# Patient Record
Sex: Female | Born: 1945 | Race: White | Hispanic: No | Smoking: Never smoker
Health system: Southern US, Community
[De-identification: ages and names within clinical notes are randomized; demographics above are authoritative.]

## PROBLEM LIST (undated history)

## (undated) ENCOUNTER — Emergency Department (HOSPITAL_COMMUNITY): Admission: EM | Payer: Self-pay | Source: Home / Self Care

## (undated) DIAGNOSIS — E119 Type 2 diabetes mellitus without complications: Secondary | ICD-10-CM

## (undated) DIAGNOSIS — E079 Disorder of thyroid, unspecified: Secondary | ICD-10-CM

## (undated) HISTORY — DX: Disorder of thyroid, unspecified: E07.9

## (undated) HISTORY — DX: Type 2 diabetes mellitus without complications: E11.9

## (undated) MED FILL — Ferumoxytol Inj 510 MG/17ML (30 MG/ML) (Elemental Fe): INTRAVENOUS | Qty: 17 | Status: AC

---

## 2014-03-16 ENCOUNTER — Ambulatory Visit: Payer: Self-pay | Admitting: Ophthalmology

## 2014-03-16 ENCOUNTER — Encounter (INDEPENDENT_AMBULATORY_CARE_PROVIDER_SITE_OTHER): Payer: Self-pay | Admitting: Ophthalmology

## 2014-06-01 ENCOUNTER — Ambulatory Visit: Payer: Self-pay | Admitting: Family Medicine

## 2016-01-23 ENCOUNTER — Ambulatory Visit (INDEPENDENT_AMBULATORY_CARE_PROVIDER_SITE_OTHER): Payer: Medicare Other | Admitting: Podiatry

## 2016-01-23 ENCOUNTER — Ambulatory Visit (INDEPENDENT_AMBULATORY_CARE_PROVIDER_SITE_OTHER): Payer: Medicare Other

## 2016-01-23 ENCOUNTER — Encounter: Payer: Self-pay | Admitting: Podiatry

## 2016-01-23 DIAGNOSIS — M722 Plantar fascial fibromatosis: Secondary | ICD-10-CM

## 2016-01-23 MED ORDER — MELOXICAM 15 MG PO TABS: 15.0000 mg | ORAL_TABLET | Freq: Every day | ORAL | Status: DC

## 2016-01-23 NOTE — Patient Instructions (Signed)

## 2016-01-23 NOTE — Progress Notes (Signed)
   Subjective:    Patient ID: Debbie Lopez, female    DOB: Jul 24, 1946, 70 y.o.   MRN: [id number]  HPI: She presents today with a chief complaint of a painful right heel. States is the bothering her for several months and he seems to be getting worse with the first step in the morning. She's tried over-the-counter inserts to no avail.    Review of Systems  Musculoskeletal: Positive for gait problem.       Objective:   Physical Exam: Vital signs are stable she is alert and oriented 3. Strong palpable pulses bilateral. Neurologic sensorium is intact per Semmes-Weinstein monofilament. Deep tendon reflexes are intact bilaterally muscle strength is 5 over 5 dorsiflexion plantar flexors and inverters everters all intrinsic musculature is intact. Orthopedic evaluation of his result to assist with ankle range of motion without crepitation. She has pain on palpation medial calcaneal tubercle of the right heel. No pain on palpation in the calf. Radius taken today demonstrate a thick soft tissue region of the plantar fasciitis insertion site. No fractures identified.        Assessment & Plan:  Plantar fasciitis right.  Plan: Injected the right heel today with Kenalog and local anesthetic. Started her on meloxicam 15 mg 1 by mouth daily. Also place her in a plantar fascial brace and a night splint. Discussed proper shoe gear stretching exercises ice therapy in a sugar modification. We discussed the etiology pathology conservative or surgical therapies. I will follow-up with her in 1 month.

## 2016-02-20 ENCOUNTER — Ambulatory Visit (INDEPENDENT_AMBULATORY_CARE_PROVIDER_SITE_OTHER): Payer: Medicare Other | Admitting: Podiatry

## 2016-02-20 ENCOUNTER — Encounter: Payer: Self-pay | Admitting: Podiatry

## 2016-02-20 VITALS — BP 153/92 | HR 86 | Resp 12

## 2016-02-20 DIAGNOSIS — M722 Plantar fascial fibromatosis: Secondary | ICD-10-CM

## 2016-02-20 NOTE — Progress Notes (Signed)
She presents today for a follow-up of plantar fasciitis of the right foot states that she is approximately 85-95% better. She continues conservative therapies including plantar fascial brace night splint and meloxicam.  Objective: Vital signs stable alert and oriented 3. Pulses are palpable. She has no reproducible pain on palpation in medial continue tubercle of the right heel.  Assessment: Resolving plantar fasciitis right.  Plan: Discussed etiology pathology conservative versus surgical therapies. I recommend she continue all conservative therapies until she is 1 month out of pain.

## 2016-04-02 ENCOUNTER — Ambulatory Visit: Payer: Medicare Other | Admitting: Podiatry

## 2016-05-12 ENCOUNTER — Other Ambulatory Visit: Payer: Self-pay | Admitting: Podiatry

## 2016-11-10 ENCOUNTER — Other Ambulatory Visit: Payer: Self-pay | Admitting: Podiatry

## 2016-12-12 ENCOUNTER — Other Ambulatory Visit: Payer: Self-pay | Admitting: Podiatry

## 2017-01-07 ENCOUNTER — Other Ambulatory Visit: Payer: Self-pay | Admitting: Podiatry

## 2017-03-15 ENCOUNTER — Other Ambulatory Visit: Payer: Self-pay | Admitting: Family Medicine

## 2017-03-15 DIAGNOSIS — Z1231 Encounter for screening mammogram for malignant neoplasm of breast: Secondary | ICD-10-CM

## 2017-04-12 ENCOUNTER — Encounter: Payer: Self-pay | Admitting: Radiology

## 2017-04-12 ENCOUNTER — Ambulatory Visit
Admission: RE | Admit: 2017-04-12 | Discharge: 2017-04-12 | Disposition: A | Discharge status: Home / Self Care | Payer: Medicare Other | Source: Ambulatory Visit | Attending: Family Medicine | Admitting: Family Medicine

## 2017-04-12 DIAGNOSIS — Z1231 Encounter for screening mammogram for malignant neoplasm of breast: Secondary | ICD-10-CM | POA: Diagnosis not present

## 2019-07-31 ENCOUNTER — Other Ambulatory Visit: Payer: Self-pay

## 2019-07-31 DIAGNOSIS — Z20822 Contact with and (suspected) exposure to covid-19: Secondary | ICD-10-CM

## 2019-08-02 LAB — NOVEL CORONAVIRUS, NAA: SARS-CoV-2, NAA: NOT DETECTED

## 2020-05-31 DEATH — deceased

## 2022-11-19 ENCOUNTER — Inpatient Hospital Stay: Payer: Medicare Other

## 2022-11-19 ENCOUNTER — Inpatient Hospital Stay: Payer: Medicare Other | Attending: Internal Medicine | Admitting: Internal Medicine

## 2022-11-19 ENCOUNTER — Encounter: Payer: Self-pay | Admitting: Internal Medicine

## 2022-11-19 VITALS — BP 144/60 | HR 87 | Temp 98.7°F | Resp 20 | Wt 178.0 lb

## 2022-11-19 DIAGNOSIS — D509 Iron deficiency anemia, unspecified: Secondary | ICD-10-CM | POA: Diagnosis not present

## 2022-11-19 DIAGNOSIS — D61818 Other pancytopenia: Secondary | ICD-10-CM | POA: Insufficient documentation

## 2022-11-19 DIAGNOSIS — D696 Thrombocytopenia, unspecified: Secondary | ICD-10-CM | POA: Diagnosis not present

## 2022-11-19 DIAGNOSIS — D72819 Decreased white blood cell count, unspecified: Secondary | ICD-10-CM | POA: Insufficient documentation

## 2022-11-19 DIAGNOSIS — E119 Type 2 diabetes mellitus without complications: Secondary | ICD-10-CM | POA: Insufficient documentation

## 2022-11-19 DIAGNOSIS — E039 Hypothyroidism, unspecified: Secondary | ICD-10-CM | POA: Insufficient documentation

## 2022-11-19 DIAGNOSIS — Z79899 Other long term (current) drug therapy: Secondary | ICD-10-CM | POA: Diagnosis not present

## 2022-11-19 DIAGNOSIS — Z809 Family history of malignant neoplasm, unspecified: Secondary | ICD-10-CM | POA: Diagnosis not present

## 2022-11-19 DIAGNOSIS — G629 Polyneuropathy, unspecified: Secondary | ICD-10-CM | POA: Insufficient documentation

## 2022-11-19 DIAGNOSIS — Z8719 Personal history of other diseases of the digestive system: Secondary | ICD-10-CM | POA: Insufficient documentation

## 2022-11-19 DIAGNOSIS — E538 Deficiency of other specified B group vitamins: Secondary | ICD-10-CM | POA: Insufficient documentation

## 2022-11-19 DIAGNOSIS — Z7984 Long term (current) use of oral hypoglycemic drugs: Secondary | ICD-10-CM | POA: Insufficient documentation

## 2022-11-19 DIAGNOSIS — D649 Anemia, unspecified: Secondary | ICD-10-CM | POA: Insufficient documentation

## 2022-11-19 LAB — CBC WITH DIFFERENTIAL/PLATELET
Abs Immature Granulocytes: 0.01 10*3/uL (ref 0.00–0.07)
Basophils Absolute: 0 10*3/uL (ref 0.0–0.1)
Basophils Relative: 1 %
Eosinophils Absolute: 0 10*3/uL (ref 0.0–0.5)
Eosinophils Relative: 0 %
HCT: 33.3 % — ABNORMAL LOW (ref 36.0–46.0)
Hemoglobin: 10.1 g/dL — ABNORMAL LOW (ref 12.0–15.0)
Immature Granulocytes: 0 %
Lymphocytes Relative: 26 %
Lymphs Abs: 1.4 10*3/uL (ref 0.7–4.0)
MCH: 25.8 pg — ABNORMAL LOW (ref 26.0–34.0)
MCHC: 30.3 g/dL (ref 30.0–36.0)
MCV: 85.2 fL (ref 80.0–100.0)
Monocytes Absolute: 0.6 10*3/uL (ref 0.1–1.0)
Monocytes Relative: 12 %
Neutro Abs: 3.2 10*3/uL (ref 1.7–7.7)
Neutrophils Relative %: 61 %
Platelets: 145 10*3/uL — ABNORMAL LOW (ref 150–400)
RBC: 3.91 MIL/uL (ref 3.87–5.11)
RDW: 17.4 % — ABNORMAL HIGH (ref 11.5–15.5)
WBC: 5.3 10*3/uL (ref 4.0–10.5)
nRBC: 0 % (ref 0.0–0.2)

## 2022-11-19 LAB — IRON AND TIBC
Iron: 51 ug/dL (ref 28–170)
Saturation Ratios: 10 % — ABNORMAL LOW (ref 10.4–31.8)
TIBC: 517 ug/dL — ABNORMAL HIGH (ref 250–450)
UIBC: 466 ug/dL

## 2022-11-19 LAB — FOLATE: Folate: 15 ng/mL (ref >5.9)

## 2022-11-19 LAB — VITAMIN B12: Vitamin B-12: 164 pg/mL — ABNORMAL LOW (ref 180–914)

## 2022-11-19 LAB — HEPATITIS C ANTIBODY: HCV Ab: NONREACTIVE

## 2022-11-19 LAB — HEPATITIS B CORE ANTIBODY, IGM: Hep B C IgM: NONREACTIVE

## 2022-11-19 LAB — FERRITIN: Ferritin: 5 ng/mL — ABNORMAL LOW (ref 11–307)

## 2022-11-19 LAB — RETICULOCYTES
Immature Retic Fract: 21 % — ABNORMAL HIGH (ref 2.3–15.9)
RBC.: 3.9 MIL/uL (ref 3.87–5.11)
Retic Count, Absolute: 67.1 10*3/uL (ref 19.0–186.0)
Retic Ct Pct: 1.7 % (ref 0.4–3.1)

## 2022-11-19 LAB — HEPATITIS B SURFACE ANTIGEN: Hepatitis B Surface Ag: NONREACTIVE

## 2022-11-19 NOTE — Progress Notes (Signed)
[facility]  Telephone:(336) [phone] [phone]  ID: Debbie Lopez OB: [birth year]  MR#: [id number]  QVZ#:[id number]  Patient Care Team: Kandyce Rud, MD as PCP - General (Family Medicine)  REFERRING PROVIDER: Pancytopenia  REASON FOR REFERRAL: Dr. Larwance Sachs  HPI: Debbie Lopez is a 76 y.o. female with past medical history of diabetes, neuropathy and hypothyroidism was referred to hematology for pancytopenia.  Patient had routine blood work with her PCP on 11/05/2022.  WBC 3.6, hemoglobin 9.2, MCV 86, platelet 100, ANC 2.29 and lymphocyte 0.84.  CBC from May 2023 showed normal WBC and mild anemia hemoglobin 11.2.  Patient has history of low platelets at least since April 2019 ranging high 90s to 130.  Patient denies any recent illnesses.  2 weeks ago she had hemorrhoidal bleed which lasted for about 4 days but has resolved now.  Denies any new medications or herbal supplements.  Dr. Larwance Sachs discontinued her meloxicam recently.  She has chronic muscle aches.  Denies any fever, chills, weight loss.  REVIEW OF SYSTEMS:   ROS  As per HPI. Otherwise, a complete review of systems is negative.  PAST MEDICAL HISTORY: Past Medical History:  Diagnosis Date   Diabetes mellitus without complication (HCC)    Thyroid disease     PAST SURGICAL HISTORY: History reviewed. No pertinent surgical history.  FAMILY HISTORY: Family History  Problem Relation Age of Onset   Cancer Maternal Grandmother     HEALTH MAINTENANCE: Social History   Tobacco Use   Smoking status: Never  Substance Use Topics   Alcohol use: Yes   Drug use: No     No Known Allergies  Current Outpatient Medications  Medication Sig Dispense Refill   aspirin 325 MG tablet Take by mouth.     atorvastatin (LIPITOR) 10 MG tablet Take by mouth.     atorvastatin (LIPITOR) 10 MG tablet TAKE 1 TABLET (10 MG TOTAL) BY MOUTH NIGHTLY.  1   glipiZIDE (GLUCOTROL XL) 10 MG 24 hr tablet TAKE 1  TABLET(10 MG) BY MOUTH EVERY DAY     levothyroxine (SYNTHROID, LEVOTHROID) 100 MCG tablet TAKE 1 TABLET BY MOUTH ONCE A DAY ON EMPTY STOMACH WITH GLASS OF WATER 30 MINUTES BEFORE BREAKFAST     levothyroxine (SYNTHROID, LEVOTHROID) 100 MCG tablet TAKE 1 TABLET BY MOUTH ONCE A DAY ON EMPTY STOMACH WITH GLASS OF WATER 30 MINUTES BEFORE BREAKFAST  5   LYRICA 75 MG capsule Take 75 mg by mouth 2 (two) times daily.  1   metFORMIN (GLUCOPHAGE) 500 MG tablet Take by mouth.     metFORMIN (GLUCOPHAGE) 500 MG tablet Take 500 mg by mouth 2 (two) times daily with a meal.  3   pantoprazole (PROTONIX) 20 MG tablet TAKE 1 TABLET(20 MG) BY MOUTH EVERY DAY     pioglitazone (ACTOS) 30 MG tablet Take by mouth.     pregabalin (LYRICA) 75 MG capsule Take by mouth.     Silica (SILICON DIOXIDE) POWD Take by mouth.     meloxicam (MOBIC) 15 MG tablet TAKE 1 TABLET BY MOUTH EVERY DAY 30 tablet 0   No current facility-administered medications for this visit.    OBJECTIVE: Vitals:   11/19/22 1406  BP: (!) 144/60  Pulse: 87  Resp: 20  Temp: 98.7 F (37.1 C)  SpO2: 100%     There is no height or weight on file to calculate BMI.      General: Well-developed, well-nourished, no acute distress. Eyes: Pink conjunctiva, anicteric sclera.  HEENT: Normocephalic, moist mucous membranes, clear oropharnyx. Lungs: Clear to auscultation bilaterally. Heart: Regular rate and rhythm. No rubs, murmurs, or gallops. Abdomen: Soft, nontender, nondistended. No organomegaly noted, normoactive bowel sounds. Musculoskeletal: No edema, cyanosis, or clubbing. Neuro: Alert, answering all questions appropriately. Cranial nerves grossly intact. Skin: No rashes or petechiae noted. Psych: Normal affect. Lymphatics: No cervical, calvicular, axillary or inguinal LAD.   LAB RESULTS:  No results found for: "NA", "K", "CL", "CO2", "GLUCOSE", "BUN", "CREATININE", "CALCIUM", "PROT", "ALBUMIN", "AST", "ALT", "ALKPHOS", "BILITOT", "GFRNONAA",  "GFRAA"  Lab Results  Component Value Date   WBC 5.3 11/19/2022   NEUTROABS 3.2 11/19/2022   HGB 10.1 (L) 11/19/2022   HCT 33.3 (L) 11/19/2022   MCV 85.2 11/19/2022   PLT 145 (L) 11/19/2022    Lab Results  Component Value Date   TIBC 517 (H) 11/19/2022   FERRITIN 5 (L) 11/19/2022   IRONPCTSAT 10 (L) 11/19/2022     STUDIES: No results found.  ASSESSMENT AND PLAN:   Debbie Lopez is a 76 y.o. female with pmh of diabetes, neuropathy and hypothyroidism was referred to hematology for pancytopenia.  #Pancytopenia  -New.  Of unclear etiology. -Patient has longstanding history of thrombocytopenia at least since 2019.  Her platelet counts are stable.  However recent blood work shows leukopenia and anemia.  Patient denies any recent illnesses, new medication or herbal supplements. -Labs as below to look for etiology.  Orders Placed This Encounter  Procedures   Iron and TIBC([facility])   Ferritin   CBC with Differential   Vitamin B12   Folate   Reticulocytes   Flow cytometry panel-leukemia/lymphoma work-up   Copper, serum   Hepatitis B core antibody, IgM   Hepatitis B surface antigen   Hepatitis C antibody   RTC in 2 weeks for MD visit to discuss labs.  Patient expressed understanding and was in agreement with this plan. She also understands that She can call clinic at any time with any questions, concerns, or complaints.   I spent a total of 45 minutes reviewing chart data, face-to-face evaluation with the patient, counseling and coordination of care as detailed above.  Michaelyn Barter, MD   11/19/2022 4:24 PM

## 2022-11-23 LAB — COMP PANEL: LEUKEMIA/LYMPHOMA

## 2022-11-29 ENCOUNTER — Inpatient Hospital Stay: Payer: Medicare Other | Admitting: Internal Medicine

## 2022-11-29 ENCOUNTER — Encounter: Payer: Self-pay | Admitting: Internal Medicine

## 2022-11-29 VITALS — HR 93 | Temp 96.3°F | Resp 20 | Wt 181.7 lb

## 2022-11-29 DIAGNOSIS — D61818 Other pancytopenia: Secondary | ICD-10-CM | POA: Diagnosis not present

## 2022-11-29 DIAGNOSIS — D696 Thrombocytopenia, unspecified: Secondary | ICD-10-CM | POA: Diagnosis not present

## 2022-11-29 DIAGNOSIS — D509 Iron deficiency anemia, unspecified: Secondary | ICD-10-CM | POA: Diagnosis not present

## 2022-11-29 DIAGNOSIS — E538 Deficiency of other specified B group vitamins: Secondary | ICD-10-CM | POA: Diagnosis not present

## 2022-11-29 LAB — COPPER, SERUM: Copper: 130 ug/dL (ref 80–158)

## 2022-11-29 NOTE — Progress Notes (Signed)
[facility]  Telephone:(336) [phone] Fax:(336) [phone]  ID: Debbie Lopez OB: Sep 22, 1946  MR#: [id number]  IRW#:[id number]  Patient Care Team: Kandyce Rud, MD as PCP - General (Family Medicine)  REFERRING PROVIDER: Pancytopenia  REASON FOR REFERRAL: Dr. Larwance Sachs  HPI: Debbie Lopez is a 76 y.o. female with past medical history of diabetes, neuropathy and hypothyroidism was referred to hematology for pancytopenia.  Patient had routine blood work with her PCP on 11/05/2022.  WBC 3.6, hemoglobin 9.2, MCV 86, platelet 100, ANC 2.29 and lymphocyte 0.84.  CBC from May 2023 showed normal WBC and mild anemia hemoglobin 11.2.  Patient has history of low platelets at least since April 2019 ranging high 90s to 130.  Patient denies any recent illnesses.  2 weeks ago she had hemorrhoidal bleed which lasted for about 4 days but has resolved now.  Denies any new medications or herbal supplements.  Dr. Larwance Sachs discontinued her meloxicam recently.  She has chronic muscle aches.  Denies any fever, chills, weight loss.  Interval history Patient was seen today to discuss labs. She has been feeling well.  Denies any complaints.   REVIEW OF SYSTEMS:   Review of Systems  All other systems reviewed and are negative.   As per HPI. Otherwise, a complete review of systems is negative.  PAST MEDICAL HISTORY: Past Medical History:  Diagnosis Date   Diabetes mellitus without complication (HCC)    Thyroid disease     PAST SURGICAL HISTORY: History reviewed. No pertinent surgical history.  FAMILY HISTORY: Family History  Problem Relation Age of Onset   Cancer Maternal Grandmother     HEALTH MAINTENANCE: Social History   Tobacco Use   Smoking status: Never  Substance Use Topics   Alcohol use: Yes   Drug use: No     No Known Allergies  Current Outpatient Medications  Medication Sig Dispense Refill   aspirin 325 MG tablet Take by mouth.     atorvastatin  (LIPITOR) 10 MG tablet Take by mouth.     glipiZIDE (GLUCOTROL XL) 10 MG 24 hr tablet TAKE 1 TABLET(10 MG) BY MOUTH EVERY DAY     levothyroxine (SYNTHROID, LEVOTHROID) 100 MCG tablet TAKE 1 TABLET BY MOUTH ONCE A DAY ON EMPTY STOMACH WITH GLASS OF WATER 30 MINUTES BEFORE BREAKFAST     losartan (COZAAR) 50 MG tablet Take by mouth.     metFORMIN (GLUCOPHAGE) 500 MG tablet Take 500 mg by mouth 2 (two) times daily with a meal.  3   pantoprazole (PROTONIX) 20 MG tablet TAKE 1 TABLET(20 MG) BY MOUTH EVERY DAY     pioglitazone (ACTOS) 30 MG tablet Take by mouth.     pregabalin (LYRICA) 75 MG capsule Take by mouth.     Silica (SILICON DIOXIDE) POWD Take by mouth.     atorvastatin (LIPITOR) 10 MG tablet TAKE 1 TABLET (10 MG TOTAL) BY MOUTH NIGHTLY.  1   levothyroxine (SYNTHROID, LEVOTHROID) 100 MCG tablet TAKE 1 TABLET BY MOUTH ONCE A DAY ON EMPTY STOMACH WITH GLASS OF WATER 30 MINUTES BEFORE BREAKFAST  5   LYRICA 75 MG capsule Take 75 mg by mouth 2 (two) times daily.  1   meloxicam (MOBIC) 15 MG tablet TAKE 1 TABLET BY MOUTH EVERY DAY 30 tablet 0   metFORMIN (GLUCOPHAGE) 500 MG tablet Take by mouth.     No current facility-administered medications for this visit.    OBJECTIVE: Vitals:   11/29/22 1037  Pulse: 93  Resp: 20  Temp: (!) 96.3 F (35.7 C)  SpO2: 100%     There is no height or weight on file to calculate BMI.      General: Well-developed, well-nourished, no acute distress. Eyes: Pink conjunctiva, anicteric sclera. HEENT: Normocephalic, moist mucous membranes, clear oropharnyx. Lungs: Clear to auscultation bilaterally. Heart: Regular rate and rhythm. No rubs, murmurs, or gallops. Abdomen: Soft, nontender, nondistended. No organomegaly noted, normoactive bowel sounds. Musculoskeletal: No edema, cyanosis, or clubbing. Neuro: Alert, answering all questions appropriately. Cranial nerves grossly intact. Skin: No rashes or petechiae noted. Psych: Normal affect. Lymphatics: No  cervical, calvicular, axillary or inguinal LAD.   LAB RESULTS:  No results found for: "NA", "K", "CL", "CO2", "GLUCOSE", "BUN", "CREATININE", "CALCIUM", "PROT", "ALBUMIN", "AST", "ALT", "ALKPHOS", "BILITOT", "GFRNONAA", "GFRAA"  Lab Results  Component Value Date   WBC 5.3 11/19/2022   NEUTROABS 3.2 11/19/2022   HGB 10.1 (L) 11/19/2022   HCT 33.3 (L) 11/19/2022   MCV 85.2 11/19/2022   PLT 145 (L) 11/19/2022    Lab Results  Component Value Date   TIBC 517 (H) 11/19/2022   FERRITIN 5 (L) 11/19/2022   IRONPCTSAT 10 (L) 11/19/2022     STUDIES: No results found.  ASSESSMENT AND PLAN:   Debbie Lopez is a 76 y.o. female with pmh of diabetes, neuropathy and hypothyroidism was referred to hematology for pancytopenia.  #Anemia # Iron deficiency # B12 deficiency -Likely secondary to iron deficiency and B12 deficiency.  -Workup showed ferritin of 5.  Discussed about oral iron versus IV.  Side effects such as constipation, diarrhea, GI upset and nausea with oral pills were discussed.  Low but potential risk of anaphylactic reaction was discussed with IV infusion.  Patient would like to proceed with IV formulation.  I will schedule for IV Feraheme weekly x 2.  Causes of low iron could be from chronic GI bleed or malabsorption.  Discussed about GI referral and need for colonoscopy and endoscopy.  Patient would like to hold off on GI intervention at this time and would like to see how she responds to iron infusions.  He was taking meloxicam once a day for long time which was recently discontinued.  -B12 low at 164.  Will proceed with IM cyanocobalamin 1000 mcg weekly x 4. -Repeat iron panel, B12 and CBC in 3 months.  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 Patient has longstanding history of thrombocytopenia at least since 2019.  Her platelet counts are stable in 90-100s.  -B12 deficiency can also cause low platelets.  Will supplement. -Hepatitis B/C nonreactive.  #  Leukopenia -WBC from 11/05/2022 3.6.  Repeat lab showed normal WBC. -Flow cytometry was done for pancytopenia which was negative.  Scheduled for IV Feraheme weekly x 2, B12 injection weekly x 4 RTC in 3 months for MD visit, labs 2 days prior.  Patient expressed understanding and was in agreement with this plan. She also understands that She can call clinic at any time with any questions, concerns, or complaints.   I spent a total of 45 minutes reviewing chart data, face-to-face evaluation with the patient, counseling and coordination of care as detailed above.  Michaelyn Barter, MD   11/29/2022 11:22 AM

## 2022-12-03 ENCOUNTER — Inpatient Hospital Stay: Payer: Medicare Other | Attending: Internal Medicine

## 2022-12-03 DIAGNOSIS — Z79899 Other long term (current) drug therapy: Secondary | ICD-10-CM | POA: Insufficient documentation

## 2022-12-03 DIAGNOSIS — D509 Iron deficiency anemia, unspecified: Secondary | ICD-10-CM | POA: Diagnosis not present

## 2022-12-03 DIAGNOSIS — E538 Deficiency of other specified B group vitamins: Secondary | ICD-10-CM | POA: Diagnosis not present

## 2022-12-03 DIAGNOSIS — D61818 Other pancytopenia: Secondary | ICD-10-CM | POA: Diagnosis present

## 2022-12-03 MED ORDER — CYANOCOBALAMIN 1000 MCG/ML IJ SOLN
1000.0000 ug | Freq: Once | INTRAMUSCULAR | Status: AC
  Administered 2022-12-03: 1000 ug via INTRAMUSCULAR
  Filled 2022-12-03: qty 1

## 2022-12-10 ENCOUNTER — Inpatient Hospital Stay: Payer: Medicare Other

## 2022-12-10 VITALS — BP 154/54 | HR 95 | Temp 97.1°F | Resp 18

## 2022-12-10 DIAGNOSIS — D509 Iron deficiency anemia, unspecified: Secondary | ICD-10-CM

## 2022-12-10 DIAGNOSIS — D61818 Other pancytopenia: Secondary | ICD-10-CM | POA: Diagnosis not present

## 2022-12-10 MED ORDER — SODIUM CHLORIDE 0.9 % IV SOLN
Freq: Once | INTRAVENOUS | Status: AC
  Filled 2022-12-10: qty 250

## 2022-12-10 MED ORDER — SODIUM CHLORIDE 0.9 % IV SOLN
510.0000 mg | Freq: Once | INTRAVENOUS | Status: AC
  Administered 2022-12-10: 510 mg via INTRAVENOUS
  Filled 2022-12-10: qty 510

## 2022-12-10 MED ORDER — CYANOCOBALAMIN 1000 MCG/ML IJ SOLN
1000.0000 ug | Freq: Once | INTRAMUSCULAR | Status: AC
  Administered 2022-12-10: 1000 ug via INTRAMUSCULAR
  Filled 2022-12-10: qty 1

## 2022-12-10 NOTE — Patient Instructions (Signed)

## 2022-12-17 ENCOUNTER — Inpatient Hospital Stay: Payer: Medicare Other

## 2022-12-17 VITALS — BP 168/56 | HR 102 | Temp 97.3°F | Resp 18

## 2022-12-17 DIAGNOSIS — D509 Iron deficiency anemia, unspecified: Secondary | ICD-10-CM

## 2022-12-17 DIAGNOSIS — D61818 Other pancytopenia: Secondary | ICD-10-CM | POA: Diagnosis not present

## 2022-12-17 MED ORDER — SODIUM CHLORIDE 0.9 % IV SOLN
Freq: Once | INTRAVENOUS | Status: AC
  Filled 2022-12-17: qty 250

## 2022-12-17 MED ORDER — SODIUM CHLORIDE 0.9 % IV SOLN
510.0000 mg | Freq: Once | INTRAVENOUS | Status: AC
  Administered 2022-12-17: 510 mg via INTRAVENOUS
  Filled 2022-12-17: qty 510

## 2022-12-17 MED ORDER — CYANOCOBALAMIN 1000 MCG/ML IJ SOLN
1000.0000 ug | Freq: Once | INTRAMUSCULAR | Status: AC
  Administered 2022-12-17: 1000 ug via INTRAMUSCULAR
  Filled 2022-12-17: qty 1

## 2022-12-17 NOTE — Patient Instructions (Signed)
Iron Sucrose Injection What is this medication? IRON SUCROSE (EYE ern SOO krose) treats low levels of iron (iron deficiency anemia) in people with kidney disease. Iron is a mineral that plays an important role in making red blood cells, which carry oxygen from your lungs to the rest of your body. This medicine may be used for other purposes; ask your health care provider or pharmacist if you have questions. COMMON BRAND NAME(S): Venofer What should I tell my care team before I take this medication? They need to know if you have any of these conditions: Anemia not caused by low iron levels Heart disease High levels of iron in the blood Kidney disease Liver disease An unusual or allergic reaction to iron, other medications, foods, dyes, or preservatives Pregnant or trying to get pregnant Breastfeeding How should I use this medication? This medication is for infusion into a vein. It is given in a hospital or clinic setting. Talk to your care team about the use of this medication in children. While this medication may be prescribed for children as young as 2 years for selected conditions, precautions do apply. Overdosage: If you think you have taken too much of this medicine contact a poison control center or emergency room at once. NOTE: This medicine is only for you. Do not share this medicine with others. What if I miss a dose? Keep appointments for follow-up doses. It is important not to miss your dose. Call your care team if you are unable to keep an appointment. What may interact with this medication? Do not take this medication with any of the following: Deferoxamine Dimercaprol Other iron products This medication may also interact with the following: Chloramphenicol Deferasirox This list may not describe all possible interactions. Give your health care provider a list of all the medicines, herbs, non-prescription drugs, or dietary supplements you use. Also tell them if you smoke,  drink alcohol, or use illegal drugs. Some items may interact with your medicine. What should I watch for while using this medication? Visit your care team regularly. Tell your care team if your symptoms do not start to get better or if they get worse. You may need blood work done while you are taking this medication. You may need to follow a special diet. Talk to your care team. Foods that contain iron include: whole grains/cereals, dried fruits, beans, or peas, leafy green vegetables, and organ meats (liver, kidney). What side effects may I notice from receiving this medication? Side effects that you should report to your care team as soon as possible: Allergic reactions--skin rash, itching, hives, swelling of the face, lips, tongue, or throat Low blood pressure--dizziness, feeling faint or lightheaded, blurry vision Shortness of breath Side effects that usually do not require medical attention (report to your care team if they continue or are bothersome): Flushing Headache Joint pain Muscle pain Nausea Pain, redness, or irritation at injection site This list may not describe all possible side effects. Call your doctor for medical advice about side effects. You may report side effects to FDA at 1-800-FDA-1088. Where should I keep my medication? This medication is given in a hospital or clinic and will not be stored at home. NOTE: This sheet is a summary. It may not cover all possible information. If you have questions about this medicine, talk to your doctor, pharmacist, or health care provider.  2023 Elsevier/Gold Standard (2021-03-30 00:00:00)  Vitamin B12 Injection What is this medication? Vitamin B12 (VAHY tuh min B12) prevents and treats low  vitamin B12 levels in your body. It is used in people who do not get enough vitamin B12 from their diet or when their digestive tract does not absorb enough. Vitamin B12 plays an important role in maintaining the health of your nervous system and  red blood cells. This medicine may be used for other purposes; ask your health care provider or pharmacist if you have questions. COMMON BRAND NAME(S): B-12 Compliance Kit, B-12 Injection Kit, Cyomin, Dodex, LA-12, Nutri-Twelve, Physicians EZ Use B-12, Primabalt What should I tell my care team before I take this medication? They need to know if you have any of these conditions: Kidney disease Leber's disease Megaloblastic anemia An unusual or allergic reaction to cyanocobalamin, cobalt, other medications, foods, dyes, or preservatives Pregnant or trying to get pregnant Breast-feeding How should I use this medication? This medication is injected into a muscle or deeply under the skin. It is usually given in a clinic or care team's office. However, your care team may teach you how to inject yourself. Follow all instructions. Talk to your care team about the use of this medication in children. Special care may be needed. Overdosage: If you think you have taken too much of this medicine contact a poison control center or emergency room at once. NOTE: This medicine is only for you. Do not share this medicine with others. What if I miss a dose? If you are given your dose at a clinic or care team's office, call to reschedule your appointment. If you give your own injections, and you miss a dose, take it as soon as you can. If it is almost time for your next dose, take only that dose. Do not take double or extra doses. What may interact with this medication? Alcohol Colchicine This list may not describe all possible interactions. Give your health care provider a list of all the medicines, herbs, non-prescription drugs, or dietary supplements you use. Also tell them if you smoke, drink alcohol, or use illegal drugs. Some items may interact with your medicine. What should I watch for while using this medication? Visit your care team regularly. You may need blood work done while you are taking this  medication. You may need to follow a special diet. Talk to your care team. Limit your alcohol intake and avoid smoking to get the best benefit. What side effects may I notice from receiving this medication? Side effects that you should report to your care team as soon as possible: Allergic reactions--skin rash, itching, hives, swelling of the face, lips, tongue, or throat Swelling of the ankles, hands, or feet Trouble breathing Side effects that usually do not require medical attention (report to your care team if they continue or are bothersome): Diarrhea This list may not describe all possible side effects. Call your doctor for medical advice about side effects. You may report side effects to FDA at 1-800-FDA-1088. Where should I keep my medication? Keep out of the reach of children. Store at room temperature between 15 and 30 degrees C (59 and 85 degrees F). Protect from light. Throw away any unused medication after the expiration date. NOTE: This sheet is a summary. It may not cover all possible information. If you have questions about this medicine, talk to your doctor, pharmacist, or health care provider.  2023 Elsevier/Gold Standard (2008-02-07 00:00:00)

## 2022-12-25 ENCOUNTER — Inpatient Hospital Stay: Payer: Medicare Other

## 2022-12-25 DIAGNOSIS — D509 Iron deficiency anemia, unspecified: Secondary | ICD-10-CM

## 2022-12-25 DIAGNOSIS — D61818 Other pancytopenia: Secondary | ICD-10-CM | POA: Diagnosis not present

## 2022-12-25 MED ORDER — CYANOCOBALAMIN 1000 MCG/ML IJ SOLN
1000.0000 ug | Freq: Once | INTRAMUSCULAR | Status: AC
  Administered 2022-12-25: 1000 ug via INTRAMUSCULAR
  Filled 2022-12-25: qty 1

## 2023-01-27 ENCOUNTER — Encounter: Payer: Self-pay | Admitting: Internal Medicine

## 2023-02-26 ENCOUNTER — Inpatient Hospital Stay: Payer: Medicare Other | Attending: Internal Medicine

## 2023-02-26 ENCOUNTER — Ambulatory Visit: Payer: Medicare Other | Admitting: Internal Medicine

## 2023-02-26 DIAGNOSIS — Z79899 Other long term (current) drug therapy: Secondary | ICD-10-CM | POA: Insufficient documentation

## 2023-02-26 DIAGNOSIS — E039 Hypothyroidism, unspecified: Secondary | ICD-10-CM | POA: Diagnosis not present

## 2023-02-26 DIAGNOSIS — D696 Thrombocytopenia, unspecified: Secondary | ICD-10-CM | POA: Diagnosis not present

## 2023-02-26 DIAGNOSIS — D61818 Other pancytopenia: Secondary | ICD-10-CM | POA: Insufficient documentation

## 2023-02-26 DIAGNOSIS — E538 Deficiency of other specified B group vitamins: Secondary | ICD-10-CM | POA: Diagnosis not present

## 2023-02-26 DIAGNOSIS — E1140 Type 2 diabetes mellitus with diabetic neuropathy, unspecified: Secondary | ICD-10-CM | POA: Insufficient documentation

## 2023-02-26 DIAGNOSIS — Z809 Family history of malignant neoplasm, unspecified: Secondary | ICD-10-CM | POA: Insufficient documentation

## 2023-02-26 DIAGNOSIS — Z8719 Personal history of other diseases of the digestive system: Secondary | ICD-10-CM | POA: Insufficient documentation

## 2023-02-26 DIAGNOSIS — Z79890 Hormone replacement therapy (postmenopausal): Secondary | ICD-10-CM | POA: Diagnosis not present

## 2023-02-26 DIAGNOSIS — D72819 Decreased white blood cell count, unspecified: Secondary | ICD-10-CM | POA: Diagnosis not present

## 2023-02-26 DIAGNOSIS — D509 Iron deficiency anemia, unspecified: Secondary | ICD-10-CM | POA: Insufficient documentation

## 2023-02-26 LAB — CBC WITH DIFFERENTIAL/PLATELET
Abs Immature Granulocytes: 0.01 10*3/uL (ref 0.00–0.07)
Basophils Absolute: 0 10*3/uL (ref 0.0–0.1)
Basophils Relative: 1 %
Eosinophils Absolute: 0 10*3/uL (ref 0.0–0.5)
Eosinophils Relative: 0 %
HCT: 37.2 % (ref 36.0–46.0)
Hemoglobin: 11.9 g/dL — ABNORMAL LOW (ref 12.0–15.0)
Immature Granulocytes: 0 %
Lymphocytes Relative: 24 %
Lymphs Abs: 0.9 10*3/uL (ref 0.7–4.0)
MCH: 29.5 pg (ref 26.0–34.0)
MCHC: 32 g/dL (ref 30.0–36.0)
MCV: 92.3 fL (ref 80.0–100.0)
Monocytes Absolute: 0.5 10*3/uL (ref 0.1–1.0)
Monocytes Relative: 13 %
Neutro Abs: 2.5 10*3/uL (ref 1.7–7.7)
Neutrophils Relative %: 62 %
Platelets: 91 10*3/uL — ABNORMAL LOW (ref 150–400)
RBC: 4.03 MIL/uL (ref 3.87–5.11)
RDW: 19.5 % — ABNORMAL HIGH (ref 11.5–15.5)
WBC: 4 10*3/uL (ref 4.0–10.5)
nRBC: 0 % (ref 0.0–0.2)

## 2023-02-26 LAB — IRON AND TIBC
Iron: 85 ug/dL (ref 28–170)
Saturation Ratios: 28 % (ref 10.4–31.8)
TIBC: 302 ug/dL (ref 250–450)
UIBC: 217 ug/dL

## 2023-02-26 LAB — FERRITIN: Ferritin: 94 ng/mL (ref 11–307)

## 2023-02-26 LAB — VITAMIN B12: Vitamin B-12: 153 pg/mL — ABNORMAL LOW (ref 180–914)

## 2023-02-28 ENCOUNTER — Inpatient Hospital Stay: Payer: Medicare Other | Admitting: Internal Medicine

## 2023-02-28 ENCOUNTER — Encounter: Payer: Self-pay | Admitting: Internal Medicine

## 2023-02-28 ENCOUNTER — Inpatient Hospital Stay: Payer: Medicare Other

## 2023-02-28 VITALS — BP 166/66 | HR 84 | Temp 95.8°F | Resp 20 | Wt 181.9 lb

## 2023-02-28 DIAGNOSIS — E538 Deficiency of other specified B group vitamins: Secondary | ICD-10-CM | POA: Diagnosis not present

## 2023-02-28 DIAGNOSIS — M4800 Spinal stenosis, site unspecified: Secondary | ICD-10-CM | POA: Insufficient documentation

## 2023-02-28 DIAGNOSIS — E119 Type 2 diabetes mellitus without complications: Secondary | ICD-10-CM | POA: Insufficient documentation

## 2023-02-28 DIAGNOSIS — E785 Hyperlipidemia, unspecified: Secondary | ICD-10-CM | POA: Insufficient documentation

## 2023-02-28 DIAGNOSIS — D509 Iron deficiency anemia, unspecified: Secondary | ICD-10-CM | POA: Diagnosis not present

## 2023-02-28 DIAGNOSIS — E039 Hypothyroidism, unspecified: Secondary | ICD-10-CM | POA: Insufficient documentation

## 2023-02-28 DIAGNOSIS — D696 Thrombocytopenia, unspecified: Secondary | ICD-10-CM | POA: Diagnosis not present

## 2023-02-28 DIAGNOSIS — D61818 Other pancytopenia: Secondary | ICD-10-CM | POA: Diagnosis not present

## 2023-02-28 MED ORDER — CYANOCOBALAMIN 1000 MCG/ML IJ SOLN
1000.0000 ug | Freq: Once | INTRAMUSCULAR | Status: AC
  Administered 2023-02-28: 1000 ug via INTRAMUSCULAR
  Filled 2023-02-28: qty 1

## 2023-02-28 NOTE — Progress Notes (Signed)
[place]  Telephone:(336) [phone] [phone]) [phone]  ID: ZABRINA SPOHN OB: 1946-08-15  MR#: [id number]  [id number]  Patient Care Team: Derinda Late, MD as PCP - General (Family Medicine)  REFERRING PROVIDER: Pancytopenia  REASON FOR REFERRAL: Dr. Baldemar Lenis  HPI: Debbie Lopez is a 77 y.o. female with past medical history of diabetes, neuropathy and hypothyroidism was referred to hematology for pancytopenia.  Patient had routine blood work with her PCP on 11/05/2022.  WBC 3.6, hemoglobin 9.2, MCV 86, platelet 100, ANC 2.29 and lymphocyte 0.84.  CBC from May 2023 showed normal WBC and mild anemia hemoglobin 11.2.  Patient has history of low platelets at least since April 2019 ranging high 90s to 130.  Patient denies any recent illnesses.  2 weeks ago she had hemorrhoidal bleed which lasted for about 4 days but has resolved now.  Denies any new medications or herbal supplements.  Dr. Baldemar Lenis discontinued her meloxicam recently.  She has chronic muscle aches.  Denies any fever, chills, weight loss.  Interval history Patient seen today as follow up for labs.  Had IV ferraheme and tolerated well. Reports leg cramps has resolved after iron infusions. Energy levels have improved. Does not feel sleepy in the afternoon anymore. Otherwise denies any concerns.    REVIEW OF SYSTEMS:   Review of Systems  All other systems reviewed and are negative.   As per HPI. Otherwise, a complete review of systems is negative.  PAST MEDICAL HISTORY: Past Medical History:  Diagnosis Date   Diabetes mellitus without complication ([place])    Thyroid disease     PAST SURGICAL HISTORY: History reviewed. No pertinent surgical history.  FAMILY HISTORY: Family History  Problem Relation Age of Onset   Cancer Maternal Grandmother     HEALTH MAINTENANCE: Social History   Tobacco Use   Smoking status: Never  Substance Use Topics   Alcohol use: Yes   Drug use: No      No Known Allergies  Current Outpatient Medications  Medication Sig Dispense Refill   aspirin 325 MG tablet Take by mouth.     atorvastatin (LIPITOR) 10 MG tablet Take by mouth.     glipiZIDE (GLUCOTROL XL) 10 MG 24 hr tablet TAKE 1 TABLET(10 MG) BY MOUTH EVERY DAY     levothyroxine (SYNTHROID) 150 MCG tablet Take 150 mcg by mouth.     losartan (COZAAR) 50 MG tablet Take by mouth.     metFORMIN (GLUCOPHAGE) 500 MG tablet Take 500 mg by mouth 2 (two) times daily with a meal.  3   pantoprazole (PROTONIX) 20 MG tablet TAKE 1 TABLET(20 MG) BY MOUTH EVERY DAY     pioglitazone (ACTOS) 30 MG tablet Take by mouth.     pregabalin (LYRICA) 75 MG capsule Take by mouth.     Silica (SILICON DIOXIDE) POWD Take by mouth.     atorvastatin (LIPITOR) 10 MG tablet TAKE 1 TABLET (10 MG TOTAL) BY MOUTH NIGHTLY.  1   levothyroxine (SYNTHROID, LEVOTHROID) 100 MCG tablet TAKE 1 TABLET BY MOUTH ONCE A DAY ON EMPTY STOMACH WITH GLASS OF WATER 30 MINUTES BEFORE BREAKFAST  5   LYRICA 75 MG capsule Take 75 mg by mouth 2 (two) times daily.  1   meloxicam (MOBIC) 15 MG tablet TAKE 1 TABLET BY MOUTH EVERY DAY 30 tablet 0   metFORMIN (GLUCOPHAGE) 500 MG tablet Take by mouth.     No current facility-administered medications for this visit.    OBJECTIVE: Vitals:  02/28/23 1022 02/28/23 1028  BP: (!) 174/68 (!) 166/66  Pulse: 84   Resp: 20   Temp: (!) 95.8 F (35.4 C) (!) 95.8 F (35.4 C)  SpO2: 100%      There is no height or weight on file to calculate BMI.      General: Well-developed, well-nourished, no acute distress. Eyes: Pink conjunctiva, anicteric sclera. HEENT: Normocephalic, moist mucous membranes, clear oropharnyx. Lungs: Clear to auscultation bilaterally. Heart: Regular rate and rhythm. No rubs, murmurs, or gallops. Abdomen: Soft, nontender, nondistended. No organomegaly noted, normoactive bowel sounds. Musculoskeletal: No edema, cyanosis, or clubbing. Neuro: Alert, answering all questions  appropriately. Cranial nerves grossly intact. Skin: No rashes or petechiae noted. Psych: Normal affect. Lymphatics: No cervical, calvicular, axillary or inguinal LAD.   LAB RESULTS:  No results found for: "NA", "K", "CL", "CO2", "GLUCOSE", "BUN", "CREATININE", "CALCIUM", "PROT", "ALBUMIN", "AST", "ALT", "ALKPHOS", "BILITOT", "GFRNONAA", "GFRAA"  Lab Results  Component Value Date   WBC 4.0 02/26/2023   NEUTROABS 2.5 02/26/2023   HGB 11.9 (L) 02/26/2023   HCT 37.2 02/26/2023   MCV 92.3 02/26/2023   PLT 91 (L) 02/26/2023    Lab Results  Component Value Date   TIBC 302 02/26/2023   TIBC 517 (H) 11/19/2022   FERRITIN 94 02/26/2023   FERRITIN 5 (L) 11/19/2022   IRONPCTSAT 28 02/26/2023   IRONPCTSAT 10 (L) 11/19/2022     STUDIES: No results found.  ASSESSMENT AND PLAN:   Debbie Lopez is a 77 y.o. female with pmh of diabetes, neuropathy and hypothyroidism was referred to hematology for pancytopenia.  #Iron deficiency anemia -Completed IV ferraheme x 2 in Dec 2023. Responded well. Iron panel/ferritin normalized. Hb improved to 11.9.  -Declined GI referral until next blood work. If iron level drops again, would prefer GI.   #B12 deficiency - B12 162. Received IM b12 inj 1000 mcg weekly x 4. Repeat B12 still low.  - Advised to start oral b12 1000 mcg daily. Will do maintenance monthly B12 injections.  - Recheck B12 in 4 months.   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 Patient has longstanding history of thrombocytopenia at least since 2019.  Her platelet counts are stable in 90-100s.  -B12 deficiency can also cause low platelets. Hepatitis B/C nonreactive. -I will also obtain US abdo to rule liver or spleen issues.   # Leukopenia -resolved. Flow cytometry was done for pancytopenia which was negative.  B12 inj monthly x 4 RTC in 4 months md visit, labs  Patient expressed understanding and was in agreement with this plan. She also understands that She can  call clinic at any time with any questions, concerns, or complaints.   I spent a total of 45 minutes reviewing chart data, face-to-face evaluation with the patient, counseling and coordination of care as detailed above.  Jane Canary, MD   02/28/2023 12:30 PM

## 2023-02-28 NOTE — Progress Notes (Signed)
Patient has no concerns today. 

## 2023-02-28 NOTE — Patient Instructions (Addendum)
Take vitamin B12 pill 1000 mcg daily.   We will do B12 injections monthly for 4 times.   Ultrasound of the abdomen to assess your liver and spleen. We will schedule it in couple of weeks. You will hear back from me if there is anything abnormal.   I will see you back in 4 months. Recheck your blood counts, iron level and b12.

## 2023-03-05 ENCOUNTER — Ambulatory Visit
Admission: RE | Admit: 2023-03-05 | Discharge: 2023-03-05 | Disposition: A | Discharge status: Home / Self Care | Payer: Medicare Other | Source: Ambulatory Visit | Attending: Internal Medicine | Admitting: Internal Medicine

## 2023-03-05 DIAGNOSIS — D696 Thrombocytopenia, unspecified: Secondary | ICD-10-CM | POA: Insufficient documentation

## 2023-03-29 ENCOUNTER — Inpatient Hospital Stay: Payer: Medicare Other | Attending: Internal Medicine

## 2023-03-29 DIAGNOSIS — D61818 Other pancytopenia: Secondary | ICD-10-CM | POA: Diagnosis present

## 2023-03-29 DIAGNOSIS — E538 Deficiency of other specified B group vitamins: Secondary | ICD-10-CM | POA: Insufficient documentation

## 2023-03-29 DIAGNOSIS — D509 Iron deficiency anemia, unspecified: Secondary | ICD-10-CM

## 2023-03-29 MED ORDER — CYANOCOBALAMIN 1000 MCG/ML IJ SOLN
1000.0000 ug | Freq: Once | INTRAMUSCULAR | Status: AC
  Administered 2023-03-29: 1000 ug via INTRAMUSCULAR
  Filled 2023-03-29: qty 1

## 2023-04-29 ENCOUNTER — Inpatient Hospital Stay: Payer: Medicare Other | Attending: Internal Medicine

## 2023-04-29 DIAGNOSIS — E538 Deficiency of other specified B group vitamins: Secondary | ICD-10-CM | POA: Diagnosis present

## 2023-04-29 DIAGNOSIS — D509 Iron deficiency anemia, unspecified: Secondary | ICD-10-CM

## 2023-04-29 DIAGNOSIS — D61818 Other pancytopenia: Secondary | ICD-10-CM | POA: Diagnosis present

## 2023-04-29 MED ORDER — CYANOCOBALAMIN 1000 MCG/ML IJ SOLN
1000.0000 ug | Freq: Once | INTRAMUSCULAR | Status: AC
  Administered 2023-04-29: 1000 ug via INTRAMUSCULAR
  Filled 2023-04-29: qty 1

## 2023-05-29 ENCOUNTER — Inpatient Hospital Stay: Payer: Medicare Other | Attending: Internal Medicine

## 2023-05-29 DIAGNOSIS — E538 Deficiency of other specified B group vitamins: Secondary | ICD-10-CM | POA: Insufficient documentation

## 2023-05-29 DIAGNOSIS — D61818 Other pancytopenia: Secondary | ICD-10-CM | POA: Insufficient documentation

## 2023-05-29 DIAGNOSIS — D509 Iron deficiency anemia, unspecified: Secondary | ICD-10-CM

## 2023-05-29 DIAGNOSIS — Z79899 Other long term (current) drug therapy: Secondary | ICD-10-CM | POA: Insufficient documentation

## 2023-05-29 MED ORDER — CYANOCOBALAMIN 1000 MCG/ML IJ SOLN
1000.0000 ug | Freq: Once | INTRAMUSCULAR | Status: AC
  Administered 2023-05-29: 1000 ug via INTRAMUSCULAR
  Filled 2023-05-29: qty 1

## 2023-06-28 ENCOUNTER — Inpatient Hospital Stay (HOSPITAL_BASED_OUTPATIENT_CLINIC_OR_DEPARTMENT_OTHER): Payer: Medicare Other | Admitting: Internal Medicine

## 2023-06-28 ENCOUNTER — Inpatient Hospital Stay: Payer: Medicare Other

## 2023-06-28 ENCOUNTER — Inpatient Hospital Stay: Payer: Medicare Other | Attending: Internal Medicine

## 2023-06-28 VITALS — BP 148/72 | HR 80 | Temp 98.8°F | Wt 181.0 lb

## 2023-06-28 DIAGNOSIS — R161 Splenomegaly, not elsewhere classified: Secondary | ICD-10-CM | POA: Diagnosis not present

## 2023-06-28 DIAGNOSIS — K7460 Unspecified cirrhosis of liver: Secondary | ICD-10-CM | POA: Insufficient documentation

## 2023-06-28 DIAGNOSIS — R5383 Other fatigue: Secondary | ICD-10-CM | POA: Insufficient documentation

## 2023-06-28 DIAGNOSIS — Z8719 Personal history of other diseases of the digestive system: Secondary | ICD-10-CM | POA: Insufficient documentation

## 2023-06-28 DIAGNOSIS — D509 Iron deficiency anemia, unspecified: Secondary | ICD-10-CM | POA: Insufficient documentation

## 2023-06-28 DIAGNOSIS — E538 Deficiency of other specified B group vitamins: Secondary | ICD-10-CM

## 2023-06-28 DIAGNOSIS — E039 Hypothyroidism, unspecified: Secondary | ICD-10-CM | POA: Insufficient documentation

## 2023-06-28 DIAGNOSIS — D696 Thrombocytopenia, unspecified: Secondary | ICD-10-CM | POA: Diagnosis not present

## 2023-06-28 DIAGNOSIS — Z79899 Other long term (current) drug therapy: Secondary | ICD-10-CM | POA: Insufficient documentation

## 2023-06-28 DIAGNOSIS — E1140 Type 2 diabetes mellitus with diabetic neuropathy, unspecified: Secondary | ICD-10-CM | POA: Diagnosis not present

## 2023-06-28 DIAGNOSIS — D72819 Decreased white blood cell count, unspecified: Secondary | ICD-10-CM | POA: Diagnosis not present

## 2023-06-28 DIAGNOSIS — D61818 Other pancytopenia: Secondary | ICD-10-CM | POA: Insufficient documentation

## 2023-06-28 LAB — FERRITIN: Ferritin: 50 ng/mL (ref 11–307)

## 2023-06-28 LAB — CBC WITH DIFFERENTIAL/PLATELET
Abs Immature Granulocytes: 0.01 10*3/uL (ref 0.00–0.07)
Basophils Absolute: 0 10*3/uL (ref 0.0–0.1)
Basophils Relative: 1 %
Eosinophils Absolute: 0 10*3/uL (ref 0.0–0.5)
Eosinophils Relative: 0 %
HCT: 38.4 % (ref 36.0–46.0)
Hemoglobin: 12.7 g/dL (ref 12.0–15.0)
Immature Granulocytes: 0 %
Lymphocytes Relative: 23 %
Lymphs Abs: 1.1 10*3/uL (ref 0.7–4.0)
MCH: 30.5 pg (ref 26.0–34.0)
MCHC: 33.1 g/dL (ref 30.0–36.0)
MCV: 92.3 fL (ref 80.0–100.0)
Monocytes Absolute: 0.6 10*3/uL (ref 0.1–1.0)
Monocytes Relative: 12 %
Neutro Abs: 3.1 10*3/uL (ref 1.7–7.7)
Neutrophils Relative %: 64 %
Platelets: 97 10*3/uL — ABNORMAL LOW (ref 150–400)
RBC: 4.16 MIL/uL (ref 3.87–5.11)
RDW: 15.6 % — ABNORMAL HIGH (ref 11.5–15.5)
WBC: 4.8 10*3/uL (ref 4.0–10.5)
nRBC: 0 % (ref 0.0–0.2)

## 2023-06-28 LAB — IRON AND TIBC
Iron: 123 ug/dL (ref 28–170)
Saturation Ratios: 33 % — ABNORMAL HIGH (ref 10.4–31.8)
TIBC: 375 ug/dL (ref 250–450)
UIBC: 252 ug/dL

## 2023-06-28 LAB — VITAMIN B12: Vitamin B-12: 708 pg/mL (ref 180–914)

## 2023-06-28 MED ORDER — CYANOCOBALAMIN 1000 MCG/ML IJ SOLN
1000.0000 ug | Freq: Once | INTRAMUSCULAR | Status: AC
  Administered 2023-06-28: 1000 ug via INTRAMUSCULAR
  Filled 2023-06-28: qty 1

## 2023-06-28 NOTE — Progress Notes (Signed)
[facility]  Telephone:(336) [phone] Fax:(336) [phone]  ID: Debbie Lopez OB: December 04, 1946  MR#: [id number]  AOZ#:[id number]  Patient Care Team: Kandyce Rud, MD as PCP - General (Family Medicine)   HPI: Debbie Lopez is a 77 y.o. female with past medical history of diabetes, neuropathy and hypothyroidism was referred to hematology for pancytopenia.  Patient had routine blood work with her PCP on 11/05/2022.  WBC 3.6, hemoglobin 9.2, MCV 86, platelet 100, ANC 2.29 and lymphocyte 0.84.  CBC from May 2023 showed normal WBC and mild anemia hemoglobin 11.2.  Patient has history of low platelets at least since April 2019 ranging high 90s to 130.  Patient denies any recent illnesses.  2 weeks ago she had hemorrhoidal bleed which lasted for about 4 days but has resolved now.  Denies any new medications or herbal supplements.  Dr. Larwance Sachs discontinued her meloxicam recently.  She has chronic muscle aches.  Denies any fever, chills, weight loss.  Interval history Patient was seen today as follow-up for iron deficiency anemia, B12 deficiency.  And labs. She is feeling well overall.  Has fatigue on and off but overall doing well.  Denies any bleeding in urine or stools.   REVIEW OF SYSTEMS:   Review of Systems  Constitutional:  Positive for malaise/fatigue.  All other systems reviewed and are negative.   As per HPI. Otherwise, a complete review of systems is negative.  PAST MEDICAL HISTORY: Past Medical History:  Diagnosis Date   Diabetes mellitus without complication ([facility])    Thyroid disease     PAST SURGICAL HISTORY: No past surgical history on file.  FAMILY HISTORY: Family History  Problem Relation Age of Onset   Cancer Maternal Grandmother     HEALTH MAINTENANCE: Social History   Tobacco Use   Smoking status: Never  Substance Use Topics   Alcohol use: Yes   Drug use: No     No Known Allergies  Current Outpatient Medications  Medication  Sig Dispense Refill   aspirin EC 81 MG tablet Take 81 mg by mouth once.     atorvastatin (LIPITOR) 10 MG tablet Take by mouth.     glipiZIDE (GLUCOTROL XL) 10 MG 24 hr tablet TAKE 1 TABLET(10 MG) BY MOUTH EVERY DAY     levothyroxine (SYNTHROID) 150 MCG tablet Take 150 mcg by mouth.     losartan (COZAAR) 50 MG tablet Take by mouth.     metFORMIN (GLUCOPHAGE) 500 MG tablet Take 500 mg by mouth 2 (two) times daily with a meal.  3   pantoprazole (PROTONIX) 20 MG tablet TAKE 1 TABLET(20 MG) BY MOUTH EVERY DAY     pioglitazone (ACTOS) 30 MG tablet Take by mouth.     Silica (SILICON DIOXIDE) POWD Take by mouth.     atorvastatin (LIPITOR) 10 MG tablet TAKE 1 TABLET (10 MG TOTAL) BY MOUTH NIGHTLY.  1   levothyroxine (SYNTHROID, LEVOTHROID) 100 MCG tablet TAKE 1 TABLET BY MOUTH ONCE A DAY ON EMPTY STOMACH WITH GLASS OF WATER 30 MINUTES BEFORE BREAKFAST  5   LYRICA 75 MG capsule Take 75 mg by mouth 2 (two) times daily.  1   metFORMIN (GLUCOPHAGE) 500 MG tablet Take by mouth.     pregabalin (LYRICA) 75 MG capsule Take by mouth.     No current facility-administered medications for this visit.    OBJECTIVE: Vitals:   06/28/23 1357  BP: (!) 148/72  Pulse: 80  Temp: 98.8 F (37.1 C)  SpO2: 100%  There is no height or weight on file to calculate BMI.      General: Well-developed, well-nourished, no acute distress. Eyes: Pink conjunctiva, anicteric sclera. HEENT: Normocephalic, moist mucous membranes, clear oropharnyx. Lungs: Clear to auscultation bilaterally. Heart: Regular rate and rhythm. No rubs, murmurs, or gallops. Abdomen: Soft, nontender, nondistended. No organomegaly noted, normoactive bowel sounds. Musculoskeletal: No edema, cyanosis, or clubbing. Neuro: Alert, answering all questions appropriately. Cranial nerves grossly intact. Skin: No rashes or petechiae noted. Psych: Normal affect. Lymphatics: No cervical, calvicular, axillary or inguinal LAD.   LAB RESULTS:  No results  found for: "NA", "K", "CL", "CO2", "GLUCOSE", "BUN", "CREATININE", "CALCIUM", "PROT", "ALBUMIN", "AST", "ALT", "ALKPHOS", "BILITOT", "GFRNONAA", "GFRAA"  Lab Results  Component Value Date   WBC 4.8 06/28/2023   NEUTROABS 3.1 06/28/2023   HGB 12.7 06/28/2023   HCT 38.4 06/28/2023   MCV 92.3 06/28/2023   PLT 97 (L) 06/28/2023    Lab Results  Component Value Date   TIBC 375 06/28/2023   TIBC 302 02/26/2023   TIBC 517 (H) 11/19/2022   FERRITIN 50 06/28/2023   FERRITIN 94 02/26/2023   FERRITIN 5 (L) 11/19/2022   IRONPCTSAT 33 (H) 06/28/2023   IRONPCTSAT 28 02/26/2023   IRONPCTSAT 10 (L) 11/19/2022     STUDIES: No results found.  ASSESSMENT AND PLAN:   Debbie Lopez is a 76 y.o. female with pmh of diabetes, neuropathy and hypothyroidism was referred to hematology for pancytopenia.  #Iron deficiency anemia -Completed IV ferraheme x 2 in Dec 2023. Responded well. Iron panel/ferritin normalized. -Labs reviewed.  Hemoglobin is normal.  Iron panel normal.  #B12 deficiency - B12 162. Received IM b12 inj 1000 mcg weekly x 4. Repeat B12 still low.  -On vitamin B12 injection 1000 mcg monthly.  Repeat B12 level pending today. -She is also taking B12 supplements.  If her level comes back okay she can just continue with oral and we will hold off on injections.  # Thrombocytopenia # Liver cirrhosis and splenomegaly -Ultrasound abdomen showed liver cirrhosis with mild splenomegaly.  Denies any excessive alcohol use in the recent years.  Hepatitis B and C nonreactive.  Discussed with the patient about consultation with GI for further workup and need for surveillance.  Patient was agreeable for the visit.  Referral placed. -Platelets are stable.   # Leukopenia -resolved. Flow cytometry was done for pancytopenia which was negative.  Orders Placed This Encounter  Procedures   CBC with Differential (Cancer Center Only)   Iron and TIBC([facility])   Ferritin   Vitamin B12    Ambulatory referral to Gastroenterology     Patient expressed understanding and was in agreement with this plan. She also understands that She can call clinic at any time with any questions, concerns, or complaints.   I spent a total of 30 minutes reviewing chart data, face-to-face evaluation with the patient, counseling and coordination of care as detailed above.  Michaelyn Barter, MD   06/28/2023 3:59 PM

## 2023-06-28 NOTE — Progress Notes (Signed)
Patient staring to feel a little more tired lately. Patient says that you have another appointment set for her on Monday and she doesn't understand why?

## 2023-07-01 ENCOUNTER — Inpatient Hospital Stay: Payer: Medicare Other

## 2023-12-30 ENCOUNTER — Inpatient Hospital Stay: Payer: Medicare Other | Attending: Internal Medicine

## 2023-12-30 ENCOUNTER — Inpatient Hospital Stay (HOSPITAL_BASED_OUTPATIENT_CLINIC_OR_DEPARTMENT_OTHER): Payer: Medicare Other | Admitting: Internal Medicine

## 2023-12-30 ENCOUNTER — Encounter: Payer: Self-pay | Admitting: Internal Medicine

## 2023-12-30 VITALS — BP 170/56 | HR 93 | Temp 98.0°F | Wt 183.0 lb

## 2023-12-30 DIAGNOSIS — K7460 Unspecified cirrhosis of liver: Secondary | ICD-10-CM | POA: Diagnosis not present

## 2023-12-30 DIAGNOSIS — R5383 Other fatigue: Secondary | ICD-10-CM | POA: Insufficient documentation

## 2023-12-30 DIAGNOSIS — D509 Iron deficiency anemia, unspecified: Secondary | ICD-10-CM

## 2023-12-30 DIAGNOSIS — D696 Thrombocytopenia, unspecified: Secondary | ICD-10-CM | POA: Insufficient documentation

## 2023-12-30 DIAGNOSIS — Z79899 Other long term (current) drug therapy: Secondary | ICD-10-CM | POA: Insufficient documentation

## 2023-12-30 DIAGNOSIS — E538 Deficiency of other specified B group vitamins: Secondary | ICD-10-CM | POA: Insufficient documentation

## 2023-12-30 DIAGNOSIS — D72819 Decreased white blood cell count, unspecified: Secondary | ICD-10-CM | POA: Insufficient documentation

## 2023-12-30 DIAGNOSIS — G629 Polyneuropathy, unspecified: Secondary | ICD-10-CM | POA: Insufficient documentation

## 2023-12-30 DIAGNOSIS — E119 Type 2 diabetes mellitus without complications: Secondary | ICD-10-CM | POA: Insufficient documentation

## 2023-12-30 DIAGNOSIS — E039 Hypothyroidism, unspecified: Secondary | ICD-10-CM | POA: Insufficient documentation

## 2023-12-30 LAB — CBC WITH DIFFERENTIAL (CANCER CENTER ONLY)
Abs Immature Granulocytes: 0.01 10*3/uL (ref 0.00–0.07)
Basophils Absolute: 0 10*3/uL (ref 0.0–0.1)
Basophils Relative: 1 %
Eosinophils Absolute: 0 10*3/uL (ref 0.0–0.5)
Eosinophils Relative: 0 %
HCT: 36.5 % (ref 36.0–46.0)
Hemoglobin: 11.8 g/dL — ABNORMAL LOW (ref 12.0–15.0)
Immature Granulocytes: 0 %
Lymphocytes Relative: 23 %
Lymphs Abs: 1 10*3/uL (ref 0.7–4.0)
MCH: 30.3 pg (ref 26.0–34.0)
MCHC: 32.3 g/dL (ref 30.0–36.0)
MCV: 93.8 fL (ref 80.0–100.0)
Monocytes Absolute: 0.5 10*3/uL (ref 0.1–1.0)
Monocytes Relative: 11 %
Neutro Abs: 3 10*3/uL (ref 1.7–7.7)
Neutrophils Relative %: 65 %
Platelet Count: 108 10*3/uL — ABNORMAL LOW (ref 150–400)
RBC: 3.89 MIL/uL (ref 3.87–5.11)
RDW: 16.2 % — ABNORMAL HIGH (ref 11.5–15.5)
WBC Count: 4.5 10*3/uL (ref 4.0–10.5)
nRBC: 0 % (ref 0.0–0.2)

## 2023-12-30 LAB — VITAMIN B12: Vitamin B-12: 990 pg/mL — ABNORMAL HIGH (ref 180–914)

## 2023-12-30 LAB — FERRITIN: Ferritin: 41 ng/mL (ref 11–307)

## 2023-12-30 LAB — IRON AND TIBC
Iron: 88 ug/dL (ref 28–170)
Saturation Ratios: 27 % (ref 10.4–31.8)
TIBC: 332 ug/dL (ref 250–450)
UIBC: 244 ug/dL

## 2023-12-30 NOTE — Progress Notes (Signed)
[facility]  Telephone:(336) [phone] Fax:(336) [phone]  ID: Debbie Lopez OB: 09/14/1946  MR#: [id number]  AOZ#:[id number]  Patient Care Team: Kandyce Rud, MD as PCP - General (Family Medicine) Michaelyn Barter, MD as Consulting Physician (Oncology)  Reason for visit-iron deficiency anemia, vitamin B12 deficiency.  HPI: Debbie Lopez is a 77 y.o. female with past medical history of diabetes, neuropathy and hypothyroidism was referred to hematology for pancytopenia.  Patient had routine blood work with her PCP on 11/05/2022.  WBC 3.6, hemoglobin 9.2, MCV 86, platelet 100, ANC 2.29 and lymphocyte 0.84.  CBC from May 2023 showed normal WBC and mild anemia hemoglobin 11.2.  Patient has history of low platelets at least since April 2019 ranging high 90s to 130.  Last treated with IV Feraheme in December 2023. S/p multiple IM B12 injections.  Now maintained on B12 supplement 1000 mcg daily.  Interval history Patient was seen today as follow-up for iron deficiency anemia, B12 deficiency.  Over the Christmas, she had a fall and landed on both her knees.  Denies any pain.  We had made referral to GI last visit but she declined to schedule a follow-up.  Does not think she needs it.  Denies any bleeding in urine or stools.  Takes her B12 supplements.   REVIEW OF SYSTEMS:   Review of Systems  Constitutional:  Positive for malaise/fatigue.  All other systems reviewed and are negative.   As per HPI. Otherwise, a complete review of systems is negative.  PAST MEDICAL HISTORY: Past Medical History:  Diagnosis Date   Diabetes mellitus without complication ([facility])    Thyroid disease     PAST SURGICAL HISTORY: History reviewed. No pertinent surgical history.  FAMILY HISTORY: Family History  Problem Relation Age of Onset   Cancer Maternal Grandmother     HEALTH MAINTENANCE: Social History   Tobacco Use   Smoking status: Never  Substance Use Topics   Alcohol  use: Yes   Drug use: No     No Known Allergies  Current Outpatient Medications  Medication Sig Dispense Refill   aspirin EC 81 MG tablet Take 81 mg by mouth once.     atorvastatin (LIPITOR) 10 MG tablet Take by mouth.     glipiZIDE (GLUCOTROL XL) 10 MG 24 hr tablet TAKE 1 TABLET(10 MG) BY MOUTH EVERY DAY     levothyroxine (SYNTHROID) 150 MCG tablet Take 150 mcg by mouth.     losartan (COZAAR) 50 MG tablet Take by mouth.     metFORMIN (GLUCOPHAGE) 500 MG tablet Take 500 mg by mouth 2 (two) times daily with a meal.  3   pantoprazole (PROTONIX) 20 MG tablet TAKE 1 TABLET(20 MG) BY MOUTH EVERY DAY     pioglitazone (ACTOS) 30 MG tablet Take by mouth.     Silica (SILICON DIOXIDE) POWD Take by mouth.     pregabalin (LYRICA) 75 MG capsule Take by mouth.     No current facility-administered medications for this visit.    OBJECTIVE: Vitals:   12/30/23 1251 12/30/23 1312  BP: (!) 163/100 (!) 170/56  Pulse: 93   Temp: 98 F (36.7 C)   SpO2: 100%       There is no height or weight on file to calculate BMI.      General: Well-developed, well-nourished, no acute distress. Eyes: Pink conjunctiva, anicteric sclera. HEENT: Normocephalic, moist mucous membranes, clear oropharnyx. Lungs: Clear to auscultation bilaterally. Heart: Regular rate and rhythm. No rubs, murmurs, or gallops. Abdomen:  Soft, nontender, nondistended. No organomegaly noted, normoactive bowel sounds. Musculoskeletal: No edema, cyanosis, or clubbing. Neuro: Alert, answering all questions appropriately. Cranial nerves grossly intact. Skin: No rashes or petechiae noted. Psych: Normal affect. Lymphatics: No cervical, calvicular, axillary or inguinal LAD.   LAB RESULTS:  No results found for: "NA", "K", "CL", "CO2", "GLUCOSE", "BUN", "CREATININE", "CALCIUM", "PROT", "ALBUMIN", "AST", "ALT", "ALKPHOS", "BILITOT", "GFRNONAA", "GFRAA"  Lab Results  Component Value Date   WBC 4.5 12/30/2023   NEUTROABS 3.0 12/30/2023    HGB 11.8 (L) 12/30/2023   HCT 36.5 12/30/2023   MCV 93.8 12/30/2023   PLT 108 (L) 12/30/2023    Lab Results  Component Value Date   TIBC 332 12/30/2023   TIBC 375 06/28/2023   TIBC 302 02/26/2023   FERRITIN 41 12/30/2023   FERRITIN 50 06/28/2023   FERRITIN 94 02/26/2023   IRONPCTSAT 27 12/30/2023   IRONPCTSAT 33 (H) 06/28/2023   IRONPCTSAT 28 02/26/2023     STUDIES: No results found.  ASSESSMENT AND PLAN:   [name] is a 77 y.o. female with pmh of diabetes, neuropathy and hypothyroidism was referred to hematology for pancytopenia.  #Iron deficiency anemia -Completed IV ferraheme x 2 in Dec 2023. Responded well. Iron panel/ferritin normalized. -Labs reviewed.  Hemoglobin is normal.  Iron panel pending from today. -Will alternate follow-up visits with PCP since it is more comfortable for patient.  She is scheduled to follow-up with Dr. Larwance Sachs in May 2025 with labs.  I advised her to reach out to his office and request to add on iron panel and B12.  I will follow-up with her in 1 year.  #B12 deficiency -s/p IM B12 injection multiple doses until June 2024 -Now maintained on oral B12 supplements 1000 mcg daily.  B12 from last time was 700.  Pending from today.  # Thrombocytopenia # Liver cirrhosis and splenomegaly -Ultrasound abdomen showed liver cirrhosis with mild splenomegaly.  GI referral was placed however patient declined to schedule for follow-up visit.  We rediscussed about it today, patient declined. -Platelets are stable.   # Leukopenia -resolved. Flow cytometry was done for pancytopenia which was negative.  Orders Placed This Encounter  Procedures   CBC with Differential/Platelet   Ferritin   Iron and TIBC([facility])   Vitamin B12   RTC in 1 year for MD visit,  Patient expressed understanding and was in agreement with this plan. She also understands that She can call clinic at any time with any questions, concerns, or complaints.   I spent a  total of 30 minutes reviewing chart data, face-to-face evaluation with the patient, counseling and coordination of care as detailed above.  Michaelyn Barter, MD   12/30/2023 2:58 PM

## 2023-12-30 NOTE — Progress Notes (Signed)
Pt fell right before Christmas doing decorations, scratched up knees, no injuries.

## 2024-12-28 ENCOUNTER — Other Ambulatory Visit: Payer: Self-pay | Admitting: *Deleted

## 2024-12-28 DIAGNOSIS — K7460 Unspecified cirrhosis of liver: Secondary | ICD-10-CM

## 2024-12-28 DIAGNOSIS — D509 Iron deficiency anemia, unspecified: Secondary | ICD-10-CM

## 2024-12-28 DIAGNOSIS — D696 Thrombocytopenia, unspecified: Secondary | ICD-10-CM

## 2024-12-28 DIAGNOSIS — E538 Deficiency of other specified B group vitamins: Secondary | ICD-10-CM

## 2024-12-29 ENCOUNTER — Encounter: Payer: Self-pay | Admitting: Internal Medicine

## 2024-12-29 ENCOUNTER — Inpatient Hospital Stay (HOSPITAL_BASED_OUTPATIENT_CLINIC_OR_DEPARTMENT_OTHER): Payer: Medicare Other | Admitting: Internal Medicine

## 2024-12-29 ENCOUNTER — Inpatient Hospital Stay: Payer: Medicare Other | Attending: Internal Medicine

## 2024-12-29 VITALS — BP 164/70 | HR 87 | Temp 97.7°F | Resp 18 | Ht 62.0 in | Wt 185.0 lb

## 2024-12-29 DIAGNOSIS — D509 Iron deficiency anemia, unspecified: Secondary | ICD-10-CM | POA: Insufficient documentation

## 2024-12-29 DIAGNOSIS — D649 Anemia, unspecified: Secondary | ICD-10-CM

## 2024-12-29 DIAGNOSIS — M48061 Spinal stenosis, lumbar region without neurogenic claudication: Secondary | ICD-10-CM | POA: Diagnosis not present

## 2024-12-29 DIAGNOSIS — D6959 Other secondary thrombocytopenia: Secondary | ICD-10-CM | POA: Insufficient documentation

## 2024-12-29 DIAGNOSIS — D696 Thrombocytopenia, unspecified: Secondary | ICD-10-CM

## 2024-12-29 DIAGNOSIS — K7460 Unspecified cirrhosis of liver: Secondary | ICD-10-CM | POA: Diagnosis not present

## 2024-12-29 DIAGNOSIS — E538 Deficiency of other specified B group vitamins: Secondary | ICD-10-CM

## 2024-12-29 DIAGNOSIS — R5383 Other fatigue: Secondary | ICD-10-CM | POA: Insufficient documentation

## 2024-12-29 DIAGNOSIS — Z79899 Other long term (current) drug therapy: Secondary | ICD-10-CM | POA: Diagnosis not present

## 2024-12-29 DIAGNOSIS — Z87891 Personal history of nicotine dependence: Secondary | ICD-10-CM | POA: Insufficient documentation

## 2024-12-29 DIAGNOSIS — Z809 Family history of malignant neoplasm, unspecified: Secondary | ICD-10-CM | POA: Diagnosis not present

## 2024-12-29 DIAGNOSIS — E119 Type 2 diabetes mellitus without complications: Secondary | ICD-10-CM | POA: Diagnosis not present

## 2024-12-29 LAB — IRON AND TIBC
Iron: 94 ug/dL (ref 28–170)
Saturation Ratios: 24 % (ref 10.4–31.8)
TIBC: 389 ug/dL (ref 250–450)
UIBC: 295 ug/dL

## 2024-12-29 LAB — CBC WITH DIFFERENTIAL (CANCER CENTER ONLY)
Abs Immature Granulocytes: 0.01 K/uL (ref 0.00–0.07)
Basophils Absolute: 0 K/uL (ref 0.0–0.1)
Basophils Relative: 1 %
Eosinophils Absolute: 0 K/uL (ref 0.0–0.5)
Eosinophils Relative: 0 %
HCT: 33 % — ABNORMAL LOW (ref 36.0–46.0)
Hemoglobin: 10.8 g/dL — ABNORMAL LOW (ref 12.0–15.0)
Immature Granulocytes: 0 %
Lymphocytes Relative: 17 %
Lymphs Abs: 0.8 K/uL (ref 0.7–4.0)
MCH: 30.2 pg (ref 26.0–34.0)
MCHC: 32.7 g/dL (ref 30.0–36.0)
MCV: 92.2 fL (ref 80.0–100.0)
Monocytes Absolute: 0.6 K/uL (ref 0.1–1.0)
Monocytes Relative: 12 %
Neutro Abs: 3.2 K/uL (ref 1.7–7.7)
Neutrophils Relative %: 70 %
Platelet Count: 90 K/uL — ABNORMAL LOW (ref 150–400)
RBC: 3.58 MIL/uL — ABNORMAL LOW (ref 3.87–5.11)
RDW: 16.6 % — ABNORMAL HIGH (ref 11.5–15.5)
WBC Count: 4.6 K/uL (ref 4.0–10.5)
nRBC: 0 % (ref 0.0–0.2)

## 2024-12-29 LAB — VITAMIN B12: Vitamin B-12: 1518 pg/mL — ABNORMAL HIGH (ref 180–914)

## 2024-12-29 LAB — FERRITIN: Ferritin: 47 ng/mL (ref 11–307)

## 2024-12-29 NOTE — Progress Notes (Signed)
 [facility] CONSULT NOTE  Patient Care Team: Diedra Lame, MD as PCP - General (Family Medicine) Rennie Cindy SAUNDERS, MD as Consulting Physician (Oncology)  CHIEF COMPLAINTS/PURPOSE OF CONSULTATION: Anemia-   Oncology History   No problem history exists.     HISTORY OF PRESENTING ILLNESS:  Debbie Lopez 78 y.o.  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   Discussed the use of AI scribe software for clinical note transcription with the patient, who gave verbal consent to proceed.  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  She has chronic iron deficiency anemia and thrombocytopenia, previously managed with iron infusions nearly two years ago. Her most recent hemoglobin is 10.8 g/dL, decreased from 88.1 g/dL earlier in 7975, and remains below the normal threshold. Recent platelet counts have ranged from 90,000 to 108,000, and her white blood cell count was reported as normal. Iron studies from the current visit are pending. She does not take silica or silicon dioxide, despite it being listed on her medication list.  She feels generally well and denies malaise. She endorses occasional fatigue, particularly when sedentary, and reports she can fall asleep real easy. No other new symptoms were reported. She also has lumbar spinal stenosis causing back discomfort, managed with pregabalin, which was recently increased by her neurologist.      Review of Systems  Constitutional:  Negative for chills, diaphoresis, fever, malaise/fatigue and weight loss.  HENT:  Negative for nosebleeds and sore throat.   Eyes:  Negative for double vision.  Respiratory:  Negative for cough, hemoptysis, sputum production, shortness of breath and wheezing.    Cardiovascular:  Negative for chest pain, palpitations, orthopnea and leg swelling.  Gastrointestinal:  Negative for abdominal pain, blood in stool, constipation, diarrhea, heartburn, melena, nausea and vomiting.  Genitourinary:  Negative for dysuria, frequency and urgency.  Musculoskeletal:  Negative for back pain and joint pain.  Skin: Negative.  Negative for itching and rash.  Neurological:  Negative for dizziness, tingling, focal weakness, weakness and headaches.  Endo/Heme/Allergies:  Does not bruise/bleed easily.  Psychiatric/Behavioral:  Negative for depression. The patient is not nervous/anxious and does not have insomnia.     MEDICAL HISTORY:  Past Medical History:  Diagnosis Date   Diabetes mellitus without complication (HCC)    Thyroid disease     SURGICAL HISTORY: History reviewed. No pertinent surgical history.  SOCIAL HISTORY: Social History   Socioeconomic History   Marital status: Widowed    Spouse name: Not on file   Number of children: Not on file   Years of education: Not on file   Highest education level: Not on file  Occupational History   Not on file  Tobacco Use   Smoking status: Never   Smokeless tobacco: Not on file  Substance and Sexual Activity   Alcohol use: Yes   Drug use: No   Sexual activity: Not Currently    Birth control/protection: None  Other Topics Concern   Not on file  Social History Narrative   Not on file   Social Drivers of Health   Tobacco Use: Unknown (12/29/2024)   Patient History    Smoking Tobacco Use: Never    Smokeless Tobacco Use: Unknown    Passive Exposure: Not on file  Recent Concern: Tobacco Use - Medium Risk (11/09/2024)   Received from [facility]   Patient History    Smoking Tobacco Use: Former    Smokeless Tobacco Use: Never    Passive Exposure: Not on file  Financial Resource Strain: Low Risk  (11/09/2024)   Received from [facility]   Overall Financial Resource  Strain (CARDIA)    Difficulty of Paying Living Expenses: Not hard at all  Food Insecurity: No Food Insecurity (11/09/2024)   Received from [facility]   Epic    Within the past 12 months, you worried that your food would run out before you got the money to buy more.: Never true    Within the past 12 months, the food you bought just didn't last and you didn't have money to get more.: Never true  Transportation Needs: No Transportation Needs (11/09/2024)   Received from [facility] - Transportation    In the past 12 months, has lack of transportation kept you from medical appointments or from getting medications?: No    Lack of Transportation (Non-Medical): No  Physical Activity: Not on file  Stress: Not on file  Social Connections: Not on file  Intimate Partner Violence: Not on file  Depression (PHQ2-9): Low Risk (12/29/2024)   Depression (PHQ2-9)    PHQ-2 Score: 0  Alcohol Screen: Not on file  Housing: Low Risk  (11/09/2024)   Received from [facility]   Epic    In the last 12 months, was there a time when you were not able to pay the mortgage or rent on time?: No    In the past 12 months, how many times have you moved where you were living?: 1    At any time in the past 12 months, were you homeless or living in a shelter (including now)?: No  Utilities: Not At Risk (11/09/2024)   Received from [facility]   Epic    In the past 12 months has the electric, gas, oil, or water company threatened to shut off services in your home?: No  Health Literacy: Not on file    FAMILY HISTORY: Family History  Problem Relation Age of Onset   Cancer Maternal Grandmother     ALLERGIES:  has no known allergies.  MEDICATIONS:  Current Outpatient Medications  Medication Sig Dispense Refill   atorvastatin (LIPITOR) 10 MG tablet Take 10 mg by mouth daily.     glipiZIDE (GLUCOTROL XL) 10 MG 24 hr tablet TAKE 1  TABLET(10 MG) BY MOUTH EVERY DAY     levothyroxine (SYNTHROID) 150 MCG tablet Take 150 mcg by mouth.     losartan (COZAAR) 50 MG tablet Take by mouth.     metFORMIN (GLUCOPHAGE) 500 MG tablet Take 500 mg by mouth 2 (two) times daily with a meal. (Patient taking differently: Take 1,000 mg by mouth at bedtime.)  3   pantoprazole (PROTONIX) 20 MG tablet TAKE 1 TABLET(20 MG) BY MOUTH EVERY DAY     pioglitazone (ACTOS) 30 MG tablet Take 30 mg by mouth daily.     pregabalin (LYRICA) 75 MG capsule Take 100 mg by mouth 2 (two) times daily.     No current facility-administered medications for this visit.    PHYSICAL EXAMINATION:  Vitals:   12/29/24 1315 12/29/24 1329  BP: (!) 164/77 (!) 164/70  Pulse: 87   Resp: 18   Temp: 97.7 F (36.5 C)   SpO2: 99%    Filed Weights   12/29/24 1315  Weight:  185 lb (83.9 kg)    Physical Exam Vitals and nursing note reviewed.  HENT:     Head: Normocephalic and atraumatic.     Mouth/Throat:     Pharynx: Oropharynx is clear.  Eyes:     Extraocular Movements: Extraocular movements intact.     Pupils: Pupils are equal, round, and reactive to light.  Cardiovascular:     Rate and Rhythm: Normal rate and regular rhythm.  Pulmonary:     Comments: Decreased breath sounds bilaterally.  Abdominal:     Palpations: Abdomen is soft.  Musculoskeletal:        General: Normal range of motion.     Cervical back: Normal range of motion.  Skin:    General: Skin is warm.  Neurological:     General: No focal deficit present.     Mental Status: She is alert and oriented to person, place, and time.  Psychiatric:        Behavior: Behavior normal.        Judgment: Judgment normal.     LABORATORY DATA:  I have reviewed the data as listed Lab Results  Component Value Date   WBC 4.6 12/29/2024   HGB 10.8 (L) 12/29/2024   HCT 33.0 (L) 12/29/2024   MCV 92.2 12/29/2024   PLT 90 (L) 12/29/2024   No results for input(s): NA, K, CL, CO2, GLUCOSE,  BUN, CREATININE, CALCIUM, GFRNONAA, GFRAA, PROT, ALBUMIN, AST, ALT, ALKPHOS, BILITOT, BILIDIR, IBILI in the last 8760 hours.  RADIOGRAPHIC STUDIES: I have personally reviewed the radiological images as listed and agreed with the findings in the report. No results found.   Symptomatic anemia #Iron deficiency anemia- -Completed IV ferraheme x 2 in Dec 2023. Responded well. Iron panel/ferritin normalized. -Labs reviewed.  Hemoglobin is normal.  Recommend oral iron gentle iron-OTC.  #B12 deficiency: -s/p IM B12 injection multiple doses until June 2024- -Now maintained on oral B12 supplements 1000 mcg daily.     # Thrombocytopenia/ # Liver cirrhosis and splenomegaly- -Ultrasound abdomen showed liver cirrhosis with mild splenomegaly.  GI referral was previously offered but declined.  Monitor for now  # DISPOSITION;  # follow up in 6 months- MD; labs- cbc/iron studies; ferritin; Possible venofer- Dr.B     Above plan of care was discussed with patient/family in detail.  My contact information was given to the patient/family.       Cindy JONELLE Joe, MD 12/29/2024 3:38 PM

## 2024-12-29 NOTE — Progress Notes (Signed)
"   No concerns today  "

## 2024-12-29 NOTE — Assessment & Plan Note (Addendum)
#  Iron deficiency anemia- -Completed IV ferraheme x 2 in Dec 2023. Responded well. Iron panel/ferritin normalized. -Labs reviewed.  Hemoglobin is normal.  Recommend oral iron gentle iron-OTC.  #B12 deficiency: -s/p IM B12 injection multiple doses until June 2024- -Now maintained on oral B12 supplements 1000 mcg daily.     # Thrombocytopenia/ # Liver cirrhosis and splenomegaly- -Ultrasound abdomen showed liver cirrhosis with mild splenomegaly.  GI referral was previously offered but declined.  Monitor for now  # DISPOSITION;  # follow up in 6 months- MD; labs- cbc/iron studies; ferritin; Possible venofer- Dr.B

## 2025-06-29 ENCOUNTER — Inpatient Hospital Stay: Admitting: Internal Medicine

## 2025-06-29 ENCOUNTER — Inpatient Hospital Stay
